# Patient Record
Sex: Male | Born: 1990 | Race: White | Hispanic: No | Marital: Single | Smoking: Never smoker
Health system: Southern US, Community
[De-identification: ages and names within clinical notes are randomized; demographics above are authoritative.]

## PROBLEM LIST (undated history)

## (undated) DIAGNOSIS — A048 Other specified bacterial intestinal infections: Secondary | ICD-10-CM

## (undated) HISTORY — DX: Other specified bacterial intestinal infections: A04.8

---
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
## 2019-07-18 ENCOUNTER — Emergency Department (HOSPITAL_COMMUNITY): Payer: Self-pay

## 2019-07-18 ENCOUNTER — Emergency Department (HOSPITAL_COMMUNITY)
Admission: EM | Admit: 2019-07-18 | Discharge: 2019-07-18 | Disposition: A | Discharge status: Home / Self Care | Payer: Self-pay | Attending: Emergency Medicine | Admitting: Emergency Medicine

## 2019-07-18 ENCOUNTER — Encounter (HOSPITAL_COMMUNITY): Payer: Self-pay | Admitting: Emergency Medicine

## 2019-07-18 ENCOUNTER — Other Ambulatory Visit: Payer: Self-pay

## 2019-07-18 DIAGNOSIS — R197 Diarrhea, unspecified: Secondary | ICD-10-CM

## 2019-07-18 DIAGNOSIS — R112 Nausea with vomiting, unspecified: Secondary | ICD-10-CM | POA: Insufficient documentation

## 2019-07-18 DIAGNOSIS — R1013 Epigastric pain: Secondary | ICD-10-CM | POA: Insufficient documentation

## 2019-07-18 LAB — COMPREHENSIVE METABOLIC PANEL
ALT: 17 U/L (ref 0–44)
AST: 30 U/L (ref 15–41)
Albumin: 4.8 g/dL (ref 3.5–5.0)
Alkaline Phosphatase: 63 U/L (ref 38–126)
Anion gap: 17 — ABNORMAL HIGH (ref 5–15)
BUN: 9 mg/dL (ref 6–20)
CO2: 19 mmol/L — ABNORMAL LOW (ref 22–32)
Calcium: 10 mg/dL (ref 8.9–10.3)
Chloride: 101 mmol/L (ref 98–111)
Creatinine, Ser: 0.93 mg/dL (ref 0.61–1.24)
GFR calc Af Amer: >60 mL/min (ref >60)
GFR calc non Af Amer: >60 mL/min (ref >60)
Glucose, Bld: 144 mg/dL — ABNORMAL HIGH (ref 70–99)
Potassium: 3.5 mmol/L (ref 3.5–5.1)
Sodium: 137 mmol/L (ref 135–145)
Total Bilirubin: 1.3 mg/dL — ABNORMAL HIGH (ref 0.3–1.2)
Total Protein: 7.7 g/dL (ref 6.5–8.1)

## 2019-07-18 LAB — LIPASE, BLOOD: Lipase: 30 U/L (ref 11–51)

## 2019-07-18 LAB — CBC WITH DIFFERENTIAL/PLATELET
Abs Immature Granulocytes: 0.02 10*3/uL (ref 0.00–0.07)
Basophils Absolute: 0.1 10*3/uL (ref 0.0–0.1)
Basophils Relative: 1 %
Eosinophils Absolute: 0 10*3/uL (ref 0.0–0.5)
Eosinophils Relative: 1 %
HCT: 44 % (ref 39.0–52.0)
Hemoglobin: 15.2 g/dL (ref 13.0–17.0)
Immature Granulocytes: 0 %
Lymphocytes Relative: 14 %
Lymphs Abs: 1.1 10*3/uL (ref 0.7–4.0)
MCH: 28.6 pg (ref 26.0–34.0)
MCHC: 34.5 g/dL (ref 30.0–36.0)
MCV: 82.7 fL (ref 80.0–100.0)
Monocytes Absolute: 0.5 10*3/uL (ref 0.1–1.0)
Monocytes Relative: 6 %
Neutro Abs: 6.3 10*3/uL (ref 1.7–7.7)
Neutrophils Relative %: 78 %
Platelets: 316 10*3/uL (ref 150–400)
RBC: 5.32 MIL/uL (ref 4.22–5.81)
RDW: 11.6 % (ref 11.5–15.5)
WBC: 8 10*3/uL (ref 4.0–10.5)
nRBC: 0 % (ref 0.0–0.2)

## 2019-07-18 LAB — URINALYSIS, ROUTINE W REFLEX MICROSCOPIC
Bacteria, UA: NONE SEEN
Bilirubin Urine: NEGATIVE
Glucose, UA: NEGATIVE mg/dL
Hgb urine dipstick: NEGATIVE
Ketones, ur: 20 mg/dL — AB
Leukocytes,Ua: NEGATIVE
Nitrite: NEGATIVE
Protein, ur: 100 mg/dL — AB
Specific Gravity, Urine: 1.02 (ref 1.005–1.030)
pH: 9 — ABNORMAL HIGH (ref 5.0–8.0)

## 2019-07-18 MED ORDER — SODIUM CHLORIDE 0.9 % IV BOLUS: 1000.0000 mL | Freq: Once | INTRAVENOUS | Status: DC

## 2019-07-18 MED ORDER — IOHEXOL 300 MG/ML  SOLN
100.0000 mL | Freq: Once | INTRAMUSCULAR | Status: AC | PRN
  Administered 2019-07-18: 100 mL via INTRAVENOUS

## 2019-07-18 MED ORDER — SODIUM CHLORIDE 0.9 % IV BOLUS
1000.0000 mL | Freq: Once | INTRAVENOUS | Status: AC
  Administered 2019-07-18: 1000 mL via INTRAVENOUS

## 2019-07-18 MED ORDER — ONDANSETRON 4 MG PO TBDP: 4.0000 mg | ORAL_TABLET | Freq: Three times a day (TID) | ORAL | 0 refills | Status: AC | PRN

## 2019-07-18 MED ORDER — HYDROMORPHONE HCL 1 MG/ML IJ SOLN
1.0000 mg | Freq: Once | INTRAMUSCULAR | Status: AC
  Administered 2019-07-18: 16:00:00 1 mg via INTRAVENOUS
  Filled 2019-07-18: qty 1

## 2019-07-18 MED ORDER — FAMOTIDINE IN NACL 20-0.9 MG/50ML-% IV SOLN
20.0000 mg | Freq: Once | INTRAVENOUS | Status: AC
  Administered 2019-07-18: 12:00:00 20 mg via INTRAVENOUS
  Filled 2019-07-18: qty 50

## 2019-07-18 MED ORDER — FAMOTIDINE 20 MG PO TABS: 20.0000 mg | ORAL_TABLET | Freq: Two times a day (BID) | ORAL | 0 refills | Status: AC

## 2019-07-18 MED ORDER — DICYCLOMINE HCL 20 MG PO TABS: 20.0000 mg | ORAL_TABLET | Freq: Two times a day (BID) | ORAL | 0 refills | Status: AC

## 2019-07-18 MED ORDER — ONDANSETRON HCL 4 MG/2ML IJ SOLN
4.0000 mg | Freq: Once | INTRAMUSCULAR | Status: AC
  Administered 2019-07-18: 12:00:00 4 mg via INTRAVENOUS
  Filled 2019-07-18: qty 2

## 2019-07-18 NOTE — ED Provider Notes (Signed)
Bobby Young   CSN: [id number] Arrival date & time: 07/18/19  1043     History   Chief Complaint Chief Complaint  Patient presents with   Emesis   Chills   Diarrhea    HPI Bobby Young is a 28 y.o. male with history of H. pylori infection presenting for evaluation of acute onset, persistent nausea, vomiting, and diarrhea beginning around 4 AM this morning.  He reports multiple episodes of nonbloody nonbilious emesis and several episodes of watery nonbloody diarrhea.  He reports abdominal discomfort in the epigastric region.  He reports "hyperventilating "due to the discomfort but denies shortness of breath, chest pain, fever, cough.  Denies urinary symptoms.  Has not tried anything for his symptoms.  No aggravating or alleviating factors noted.  Reports he has not had any alcohol in several months, occasionally smokes marijuana.  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       The history is provided by the patient.    Past Medical History:  Diagnosis Date   Abdominal pain    H. pylori infection     Patient Active Problem List   Diagnosis Date Noted   H. pylori infection    Abdominal pain     History reviewed. No pertinent surgical history.      Home Medications    Prior to Admission medications   Medication Sig Start Date End Date Taking? Authorizing Provider  dicyclomine (BENTYL) 20 MG tablet Take 1 tablet (20 mg total) by mouth 2 (two) times daily. 07/18/19   Virgil Slinger A, PA-C  famotidine (PEPCID) 20 MG tablet Take 1 tablet (20 mg total) by mouth 2 (two) times daily. 07/18/19   Nils Flack, Eldra Word A, PA-C  ondansetron (ZOFRAN ODT) 4 MG disintegrating tablet Take 1 tablet (4 mg total) by mouth every 8 (eight) hours as needed for nausea or vomiting. 07/18/19   Renita Papa, PA-C    Family History History reviewed. No pertinent family  history.  Social History Social History   Tobacco Use   Smoking status: Never Smoker  Substance Use Topics   Alcohol use: Yes   Drug use: Yes    Types: Marijuana     Allergies   Eggs or egg-derived products and Penicillins   Review of Systems Review of Systems  Constitutional: Negative for chills and fever.  Respiratory: Negative for cough and shortness of breath.   Cardiovascular: Negative for chest pain.  Gastrointestinal: Positive for abdominal pain, diarrhea, nausea and vomiting.  Genitourinary: Negative for dysuria and hematuria.  All other systems reviewed and are negative.    Physical Exam Updated Vital Signs BP 122/72    Pulse 75    Temp 97.9 F (36.6 C) (Oral)    Resp 15    Ht 5\' 10"  (1.778 m)    Wt 79.4 kg    SpO2 100%    BMI 25.11 kg/m   Physical Exam Vitals signs and nursing Young reviewed.  Constitutional:      General: He is not in acute distress.    Appearance: He is well-developed.     Comments: Appears uncomfortable  HENT:     Head: Normocephalic and atraumatic.  Eyes:     General:        Right eye: No discharge.        Left eye: No discharge.     Conjunctiva/sclera:  Conjunctivae normal.  Neck:     Vascular: No JVD.     Trachea: No tracheal deviation.  Cardiovascular:     Rate and Rhythm: Normal rate and regular rhythm.     Heart sounds: Normal heart sounds.  Pulmonary:     Effort: Pulmonary effort is normal.     Breath sounds: Normal breath sounds.  Abdominal:     General: Abdomen is flat. There is no distension.     Palpations: Abdomen is soft.     Tenderness: There is abdominal tenderness in the epigastric area. There is no right CVA tenderness, left CVA tenderness, guarding or rebound.  Skin:    General: Skin is warm and dry.     Findings: No erythema.  Neurological:     Mental Status: He is alert.  Psychiatric:        Behavior: Behavior normal.      ED Treatments / Results  Labs (all labs ordered are listed, but only  abnormal results are displayed) Labs Reviewed  COMPREHENSIVE METABOLIC PANEL - Abnormal; Notable for the following components:      Result Value   CO2 19 (*)    Glucose, Bld 144 (*)    Total Bilirubin 1.3 (*)    Anion gap 17 (*)    All other components within normal limits  URINALYSIS, ROUTINE W REFLEX MICROSCOPIC - Abnormal; Notable for the following components:   pH 9.0 (*)    Ketones, ur 20 (*)    Protein, ur 100 (*)    All other components within normal limits  CBC WITH DIFFERENTIAL/PLATELET  LIPASE, BLOOD    EKG None  Radiology Ct Abdomen Pelvis W Contrast  Result Date: 07/18/2019 CLINICAL DATA:  Vomiting, diarrhea and body aches. EXAM: CT ABDOMEN AND PELVIS WITH CONTRAST TECHNIQUE: Multidetector CT imaging of the abdomen and pelvis was performed using the standard protocol following bolus administration of intravenous contrast. CONTRAST:  100mL OMNIPAQUE IOHEXOL 300 MG/ML  SOLN COMPARISON:  No report of prior CT scan dated 11/28/2013 FINDINGS: Lower chest: Normal. Hepatobiliary: Mild diffuse hepatic steatosis with mild hepatomegaly. Otherwise negative. Biliary tree is normal. Pancreas: Unremarkable. No pancreatic ductal dilatation or surrounding inflammatory changes. Spleen: Normal in size without focal abnormality. Adrenals/Urinary Tract: Adrenal glands are unremarkable. Kidneys are normal, without renal calculi, focal lesion, or hydronephrosis. Bladder is unremarkable. Stomach/Bowel: Stomach is within normal limits. Appendix appears normal. No evidence of bowel wall thickening, distention, or inflammatory changes. Vascular/Lymphatic: No significant vascular findings are present. No enlarged abdominal or pelvic lymph nodes. Reproductive: Prostate is unremarkable. Other: No abdominal wall hernia or abnormality. No abdominopelvic ascites. Musculoskeletal: No acute or significant osseous findings. IMPRESSION: Mild diffuse hepatic steatosis and hepatomegaly, chronic by report. Otherwise,  benign appearing abdomen and pelvis. Electronically Signed   By: Francene BoyersJames  Maxwell M.D.   On: 07/18/2019 15:53    Procedures Procedures (including critical care time)  Medications Ordered in ED Medications  sodium chloride 0.9 % bolus 1,000 mL (0 mLs Intravenous Stopped 07/18/19 1322)  ondansetron (ZOFRAN) injection 4 mg (4 mg Intravenous Given 07/18/19 1208)  famotidine (PEPCID) IVPB 20 mg premix (0 mg Intravenous Stopped 07/18/19 1256)  HYDROmorphone (DILAUDID) injection 1 mg (1 mg Intravenous Given 07/18/19 1535)  iohexol (OMNIPAQUE) 300 MG/ML solution 100 mL (100 mLs Intravenous Contrast Given 07/18/19 1545)     Initial Impression / Assessment and Plan / ED Course  I have reviewed the triage vital signs and the nursing notes.  Pertinent labs & imaging results that were  available during my care of the patient were reviewed by me and considered in my medical decision making (see chart for details).        Patient sent from methadone clinic for evaluation of nausea, vomiting, body aches, upper abdominal pain.  He is afebrile, vital signs are stable.  He however, he appears uncomfortable, hyperventilating.  No peritoneal signs on examination of the abdomen but he does have some epigastric abdominal pain.  Has a history of H. pylori infection.  Lab work today show no leukocytosis, no anemia.  His renal function is within normal limits.  His bicarb is low, could be secondary to hyperventilation but coupled with elevated anion gap could be starvation ketosis as he also has ketones in his urine.  He does not appear to be septic at this time.  UA does not suggest nephrolithiasis or UTI but does suggest dehydration.  He received IV fluids, Zofran, Pepcid in the ED with a little bit of improvement and then had worsening abdominal pain so was given 1 dose of Dilaudid so that he could comfortably undergo his CT scan.  He did report that this helped his pain.  CT scans show no evidence of acute surgical  abdominal pathology but does show some mild diffuse hepatic steatosis and hepatomegaly, chronic appearing.  On reevaluation patient resting more comfortably, tolerating p.o. fluids without difficulty.  Serial abdominal examinations are benign.  He reports that he feels as though he is withdrawing but that the Dilaudid "took the edge off ".  He feels comfortable with discharge home and will follow-up in his methadone clinic tomorrow.  He appears to have good social support with his significant other at the bedside who is quite attentive.  Suspect there is some component of his gastritis/PUD versus methadone withdrawal contributing to his symptoms today.  No evidence of perforated viscus.  Conservative therapy indicated and discussed with patient, will discharge with Zofran, Pepcid, Bentyl.  Discussed pushing fluids, advancing diet slowly.  Discussed strict ED return precautions.  Recommend follow-up with PCP for reevaluation of symptoms.  Patient and significant other verbalized understanding of and agreement with plan and patient stable for discharge home at this time.  Patient was seen and evaluated by Dr. Dalene Seltzer who agrees with assessment and plan at this time.  Final Clinical Impressions(s) / ED Diagnoses   Final diagnoses:  Nausea vomiting and diarrhea  Epigastric pain    ED Discharge Orders         Ordered    ondansetron (ZOFRAN ODT) 4 MG disintegrating tablet  Every 8 hours PRN     07/18/19 1623    famotidine (PEPCID) 20 MG tablet  2 times daily     07/18/19 1623    dicyclomine (BENTYL) 20 MG tablet  2 times daily     07/18/19 [address], [place] A, PA-C 07/19/19 1831    Alvira Monday, MD 07/19/19 2308

## 2019-07-18 NOTE — Discharge Instructions (Addendum)
You received Zofran for nausea, Pepcid and Dilaudid for pain today while you are in the emergency department.  1. Medications: Take Zofran as needed for nausea.  Let this medicine dissolve under your tongue and wait around 10-20 minutes before eating or drinking after taking this medication.  Take Pepcid twice daily with food.  You can take Bentyl as needed for crampy abdominal pain.  If you need medication assistance, you can download the [facility] app on your phone which will give discounts for most medications.  2. Treatment: rest, drink plenty of fluids, advance diet slowly.  Start with water and broth then advance to bland foods that will not upset your stomach such as crackers, mashed potatoes, and peanut butter. 3. Follow Up: Please followup with your primary doctor or a gastroenterologist in 3-5 days for discussion of your diagnoses and further evaluation after today's visit; Please return to the ER for persistent vomiting, high fevers or worsening symptoms.

## 2019-07-18 NOTE — ED Triage Notes (Signed)
Pt arrives to ED from home with complaints of emesis, diarrhea, and body aches since 0400 today. Patient states he was on the way to get his methadone today and they wound not let him get his dose.

## 2019-07-19 DIAGNOSIS — Z20828 Contact with and (suspected) exposure to other viral communicable diseases: Secondary | ICD-10-CM | POA: Diagnosis not present

## 2019-12-26 DIAGNOSIS — F411 Generalized anxiety disorder: Secondary | ICD-10-CM | POA: Diagnosis not present

## 2020-01-04 DIAGNOSIS — F411 Generalized anxiety disorder: Secondary | ICD-10-CM | POA: Diagnosis not present

## 2020-01-08 DIAGNOSIS — F411 Generalized anxiety disorder: Secondary | ICD-10-CM | POA: Diagnosis not present

## 2020-01-16 DIAGNOSIS — F411 Generalized anxiety disorder: Secondary | ICD-10-CM | POA: Diagnosis not present

## 2020-01-22 DIAGNOSIS — F411 Generalized anxiety disorder: Secondary | ICD-10-CM | POA: Diagnosis not present

## 2020-01-29 DIAGNOSIS — F411 Generalized anxiety disorder: Secondary | ICD-10-CM | POA: Diagnosis not present

## 2020-02-06 DIAGNOSIS — F411 Generalized anxiety disorder: Secondary | ICD-10-CM | POA: Diagnosis not present

## 2020-02-20 DIAGNOSIS — F411 Generalized anxiety disorder: Secondary | ICD-10-CM | POA: Diagnosis not present

## 2020-02-28 DIAGNOSIS — M5413 Radiculopathy, cervicothoracic region: Secondary | ICD-10-CM | POA: Diagnosis not present

## 2020-02-28 DIAGNOSIS — M25512 Pain in left shoulder: Secondary | ICD-10-CM | POA: Diagnosis not present

## 2020-02-28 DIAGNOSIS — M9901 Segmental and somatic dysfunction of cervical region: Secondary | ICD-10-CM | POA: Diagnosis not present

## 2020-02-28 DIAGNOSIS — M9902 Segmental and somatic dysfunction of thoracic region: Secondary | ICD-10-CM | POA: Diagnosis not present

## 2020-03-03 DIAGNOSIS — M25512 Pain in left shoulder: Secondary | ICD-10-CM | POA: Diagnosis not present

## 2020-03-03 DIAGNOSIS — M9902 Segmental and somatic dysfunction of thoracic region: Secondary | ICD-10-CM | POA: Diagnosis not present

## 2020-03-03 DIAGNOSIS — M9901 Segmental and somatic dysfunction of cervical region: Secondary | ICD-10-CM | POA: Diagnosis not present

## 2020-03-03 DIAGNOSIS — M5413 Radiculopathy, cervicothoracic region: Secondary | ICD-10-CM | POA: Diagnosis not present

## 2020-03-10 DIAGNOSIS — M5413 Radiculopathy, cervicothoracic region: Secondary | ICD-10-CM | POA: Diagnosis not present

## 2020-03-10 DIAGNOSIS — M9901 Segmental and somatic dysfunction of cervical region: Secondary | ICD-10-CM | POA: Diagnosis not present

## 2020-03-10 DIAGNOSIS — M25512 Pain in left shoulder: Secondary | ICD-10-CM | POA: Diagnosis not present

## 2020-03-10 DIAGNOSIS — M9902 Segmental and somatic dysfunction of thoracic region: Secondary | ICD-10-CM | POA: Diagnosis not present

## 2020-03-14 DIAGNOSIS — M9901 Segmental and somatic dysfunction of cervical region: Secondary | ICD-10-CM | POA: Diagnosis not present

## 2020-03-14 DIAGNOSIS — M5413 Radiculopathy, cervicothoracic region: Secondary | ICD-10-CM | POA: Diagnosis not present

## 2020-03-14 DIAGNOSIS — M9902 Segmental and somatic dysfunction of thoracic region: Secondary | ICD-10-CM | POA: Diagnosis not present

## 2020-03-14 DIAGNOSIS — M25512 Pain in left shoulder: Secondary | ICD-10-CM | POA: Diagnosis not present

## 2020-06-10 DIAGNOSIS — R634 Abnormal weight loss: Secondary | ICD-10-CM | POA: Diagnosis not present

## 2020-06-10 DIAGNOSIS — Z131 Encounter for screening for diabetes mellitus: Secondary | ICD-10-CM | POA: Diagnosis not present

## 2020-06-10 DIAGNOSIS — R197 Diarrhea, unspecified: Secondary | ICD-10-CM | POA: Diagnosis not present

## 2020-06-10 DIAGNOSIS — R1084 Generalized abdominal pain: Secondary | ICD-10-CM | POA: Diagnosis not present

## 2020-06-10 DIAGNOSIS — Z1331 Encounter for screening for depression: Secondary | ICD-10-CM | POA: Diagnosis not present

## 2020-06-12 DIAGNOSIS — R197 Diarrhea, unspecified: Secondary | ICD-10-CM | POA: Diagnosis not present

## 2020-07-08 DIAGNOSIS — F1120 Opioid dependence, uncomplicated: Secondary | ICD-10-CM | POA: Diagnosis not present

## 2020-07-15 DIAGNOSIS — F1120 Opioid dependence, uncomplicated: Secondary | ICD-10-CM | POA: Diagnosis not present

## 2020-08-15 DIAGNOSIS — F1120 Opioid dependence, uncomplicated: Secondary | ICD-10-CM | POA: Diagnosis not present

## 2020-08-16 DIAGNOSIS — F1120 Opioid dependence, uncomplicated: Secondary | ICD-10-CM | POA: Diagnosis not present

## 2020-08-17 DIAGNOSIS — F1120 Opioid dependence, uncomplicated: Secondary | ICD-10-CM | POA: Diagnosis not present

## 2020-08-18 DIAGNOSIS — F1120 Opioid dependence, uncomplicated: Secondary | ICD-10-CM | POA: Diagnosis not present

## 2020-08-19 DIAGNOSIS — F1120 Opioid dependence, uncomplicated: Secondary | ICD-10-CM | POA: Diagnosis not present

## 2020-08-20 DIAGNOSIS — F1120 Opioid dependence, uncomplicated: Secondary | ICD-10-CM | POA: Diagnosis not present

## 2020-08-21 DIAGNOSIS — F1120 Opioid dependence, uncomplicated: Secondary | ICD-10-CM | POA: Diagnosis not present

## 2020-08-22 DIAGNOSIS — F1120 Opioid dependence, uncomplicated: Secondary | ICD-10-CM | POA: Diagnosis not present

## 2020-08-23 DIAGNOSIS — F1120 Opioid dependence, uncomplicated: Secondary | ICD-10-CM | POA: Diagnosis not present

## 2020-08-24 DIAGNOSIS — F1120 Opioid dependence, uncomplicated: Secondary | ICD-10-CM | POA: Diagnosis not present

## 2020-08-25 DIAGNOSIS — F1120 Opioid dependence, uncomplicated: Secondary | ICD-10-CM | POA: Diagnosis not present

## 2020-08-26 DIAGNOSIS — F1120 Opioid dependence, uncomplicated: Secondary | ICD-10-CM | POA: Diagnosis not present

## 2020-08-27 DIAGNOSIS — F1120 Opioid dependence, uncomplicated: Secondary | ICD-10-CM | POA: Diagnosis not present

## 2020-08-28 DIAGNOSIS — F1120 Opioid dependence, uncomplicated: Secondary | ICD-10-CM | POA: Diagnosis not present

## 2020-08-29 DIAGNOSIS — F1120 Opioid dependence, uncomplicated: Secondary | ICD-10-CM | POA: Diagnosis not present

## 2020-08-30 DIAGNOSIS — F1120 Opioid dependence, uncomplicated: Secondary | ICD-10-CM | POA: Diagnosis not present

## 2020-08-31 DIAGNOSIS — F1120 Opioid dependence, uncomplicated: Secondary | ICD-10-CM | POA: Diagnosis not present

## 2020-09-01 DIAGNOSIS — F1120 Opioid dependence, uncomplicated: Secondary | ICD-10-CM | POA: Diagnosis not present

## 2020-09-02 DIAGNOSIS — F1120 Opioid dependence, uncomplicated: Secondary | ICD-10-CM | POA: Diagnosis not present

## 2020-09-03 DIAGNOSIS — F1120 Opioid dependence, uncomplicated: Secondary | ICD-10-CM | POA: Diagnosis not present

## 2020-09-04 DIAGNOSIS — F1120 Opioid dependence, uncomplicated: Secondary | ICD-10-CM | POA: Diagnosis not present

## 2020-09-05 DIAGNOSIS — F1120 Opioid dependence, uncomplicated: Secondary | ICD-10-CM | POA: Diagnosis not present

## 2020-09-06 DIAGNOSIS — F1120 Opioid dependence, uncomplicated: Secondary | ICD-10-CM | POA: Diagnosis not present

## 2020-09-07 DIAGNOSIS — F1120 Opioid dependence, uncomplicated: Secondary | ICD-10-CM | POA: Diagnosis not present

## 2020-09-08 DIAGNOSIS — F1120 Opioid dependence, uncomplicated: Secondary | ICD-10-CM | POA: Diagnosis not present

## 2020-09-09 DIAGNOSIS — F1120 Opioid dependence, uncomplicated: Secondary | ICD-10-CM | POA: Diagnosis not present

## 2020-09-10 DIAGNOSIS — F1120 Opioid dependence, uncomplicated: Secondary | ICD-10-CM | POA: Diagnosis not present

## 2020-09-11 DIAGNOSIS — F1120 Opioid dependence, uncomplicated: Secondary | ICD-10-CM | POA: Diagnosis not present

## 2020-09-12 DIAGNOSIS — F1120 Opioid dependence, uncomplicated: Secondary | ICD-10-CM | POA: Diagnosis not present

## 2020-09-13 DIAGNOSIS — F1120 Opioid dependence, uncomplicated: Secondary | ICD-10-CM | POA: Diagnosis not present

## 2020-09-14 DIAGNOSIS — F1120 Opioid dependence, uncomplicated: Secondary | ICD-10-CM | POA: Diagnosis not present

## 2020-09-15 DIAGNOSIS — F1120 Opioid dependence, uncomplicated: Secondary | ICD-10-CM | POA: Diagnosis not present

## 2020-09-17 DIAGNOSIS — F1120 Opioid dependence, uncomplicated: Secondary | ICD-10-CM | POA: Diagnosis not present

## 2020-09-18 DIAGNOSIS — F1120 Opioid dependence, uncomplicated: Secondary | ICD-10-CM | POA: Diagnosis not present

## 2020-09-19 DIAGNOSIS — F1120 Opioid dependence, uncomplicated: Secondary | ICD-10-CM | POA: Diagnosis not present

## 2020-09-20 DIAGNOSIS — F1120 Opioid dependence, uncomplicated: Secondary | ICD-10-CM | POA: Diagnosis not present

## 2020-09-21 DIAGNOSIS — F1120 Opioid dependence, uncomplicated: Secondary | ICD-10-CM | POA: Diagnosis not present

## 2020-09-22 DIAGNOSIS — F1120 Opioid dependence, uncomplicated: Secondary | ICD-10-CM | POA: Diagnosis not present

## 2020-09-23 DIAGNOSIS — F1120 Opioid dependence, uncomplicated: Secondary | ICD-10-CM | POA: Diagnosis not present

## 2020-09-24 DIAGNOSIS — F1120 Opioid dependence, uncomplicated: Secondary | ICD-10-CM | POA: Diagnosis not present

## 2020-09-25 DIAGNOSIS — F1120 Opioid dependence, uncomplicated: Secondary | ICD-10-CM | POA: Diagnosis not present

## 2020-09-26 DIAGNOSIS — F1120 Opioid dependence, uncomplicated: Secondary | ICD-10-CM | POA: Diagnosis not present

## 2020-09-27 DIAGNOSIS — F1120 Opioid dependence, uncomplicated: Secondary | ICD-10-CM | POA: Diagnosis not present

## 2020-09-28 DIAGNOSIS — F1120 Opioid dependence, uncomplicated: Secondary | ICD-10-CM | POA: Diagnosis not present

## 2020-09-29 DIAGNOSIS — F1120 Opioid dependence, uncomplicated: Secondary | ICD-10-CM | POA: Diagnosis not present

## 2020-09-30 DIAGNOSIS — F1120 Opioid dependence, uncomplicated: Secondary | ICD-10-CM | POA: Diagnosis not present

## 2020-10-01 DIAGNOSIS — F1120 Opioid dependence, uncomplicated: Secondary | ICD-10-CM | POA: Diagnosis not present

## 2020-10-02 DIAGNOSIS — F1120 Opioid dependence, uncomplicated: Secondary | ICD-10-CM | POA: Diagnosis not present

## 2020-10-03 DIAGNOSIS — F1120 Opioid dependence, uncomplicated: Secondary | ICD-10-CM | POA: Diagnosis not present

## 2020-10-04 DIAGNOSIS — F1120 Opioid dependence, uncomplicated: Secondary | ICD-10-CM | POA: Diagnosis not present

## 2020-10-05 DIAGNOSIS — F1120 Opioid dependence, uncomplicated: Secondary | ICD-10-CM | POA: Diagnosis not present

## 2020-10-06 DIAGNOSIS — F1120 Opioid dependence, uncomplicated: Secondary | ICD-10-CM | POA: Diagnosis not present

## 2020-10-07 DIAGNOSIS — F1120 Opioid dependence, uncomplicated: Secondary | ICD-10-CM | POA: Diagnosis not present

## 2020-10-08 DIAGNOSIS — F1120 Opioid dependence, uncomplicated: Secondary | ICD-10-CM | POA: Diagnosis not present

## 2020-10-09 DIAGNOSIS — F1120 Opioid dependence, uncomplicated: Secondary | ICD-10-CM | POA: Diagnosis not present

## 2020-10-10 DIAGNOSIS — F1120 Opioid dependence, uncomplicated: Secondary | ICD-10-CM | POA: Diagnosis not present

## 2020-10-11 DIAGNOSIS — F1120 Opioid dependence, uncomplicated: Secondary | ICD-10-CM | POA: Diagnosis not present

## 2020-10-12 DIAGNOSIS — F1120 Opioid dependence, uncomplicated: Secondary | ICD-10-CM | POA: Diagnosis not present

## 2020-10-13 DIAGNOSIS — F1120 Opioid dependence, uncomplicated: Secondary | ICD-10-CM | POA: Diagnosis not present

## 2020-10-14 DIAGNOSIS — F1120 Opioid dependence, uncomplicated: Secondary | ICD-10-CM | POA: Diagnosis not present

## 2020-10-17 DIAGNOSIS — F1120 Opioid dependence, uncomplicated: Secondary | ICD-10-CM | POA: Diagnosis not present

## 2020-10-18 DIAGNOSIS — F1120 Opioid dependence, uncomplicated: Secondary | ICD-10-CM | POA: Diagnosis not present

## 2020-10-19 DIAGNOSIS — F1120 Opioid dependence, uncomplicated: Secondary | ICD-10-CM | POA: Diagnosis not present

## 2020-10-20 DIAGNOSIS — F1120 Opioid dependence, uncomplicated: Secondary | ICD-10-CM | POA: Diagnosis not present

## 2020-10-21 DIAGNOSIS — F1120 Opioid dependence, uncomplicated: Secondary | ICD-10-CM | POA: Diagnosis not present

## 2020-10-21 DIAGNOSIS — U071 COVID-19: Secondary | ICD-10-CM | POA: Diagnosis not present

## 2020-10-22 DIAGNOSIS — F1120 Opioid dependence, uncomplicated: Secondary | ICD-10-CM | POA: Diagnosis not present

## 2020-10-23 DIAGNOSIS — F1120 Opioid dependence, uncomplicated: Secondary | ICD-10-CM | POA: Diagnosis not present

## 2021-01-15 IMAGING — CT CT ABD-PELV W/ CM
4 series · 13 of 46 positions shown, 18 images · IV contrast (APPLIED)
Comparison: No report of prior CT scan dated 11/28/2013

CLINICAL DATA: Vomiting, diarrhea and body aches.

EXAM:
CT ABDOMEN AND PELVIS WITH CONTRAST
TECHNIQUE: Multidetector CT imaging of the abdomen and pelvis was performed
using the standard protocol following bolus administration of
intravenous contrast.
CONTRAST:  100mL OMNIPAQUE IOHEXOL 300 MG/ML  SOLN

[Series 3: abdomen 5.0 · axial · 0.80mm/px · z∈[+921,+1276]mm · 7 of 95 slices shown, 12 images]
[im 12/95  soft-tissue]
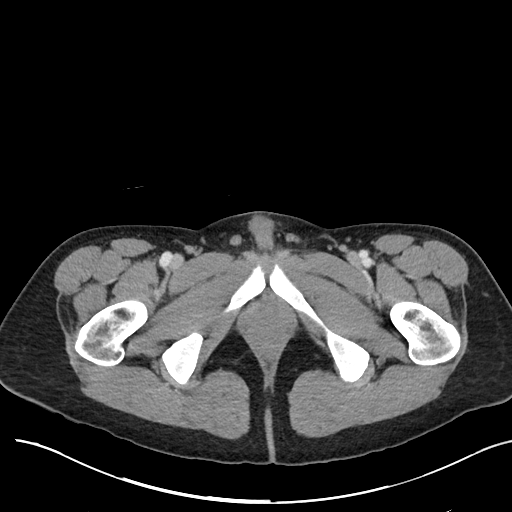
[im 12/95  bone]
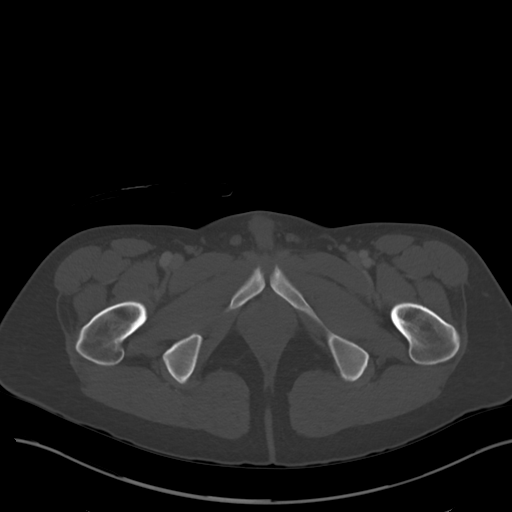
[im 24/95  soft-tissue]
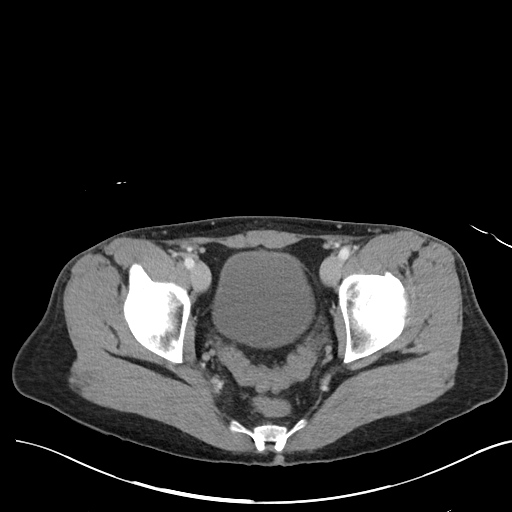
[im 36/95  soft-tissue]
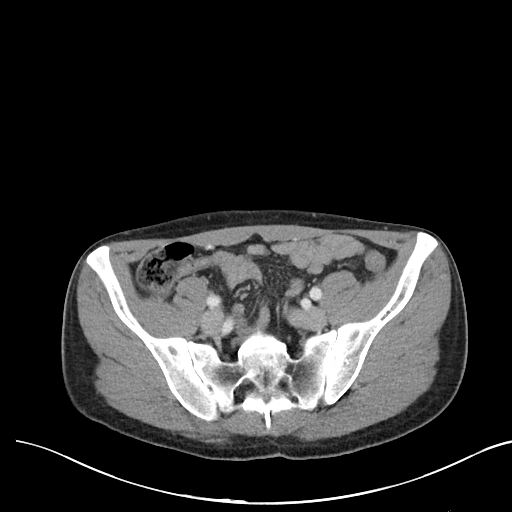
[im 48/95  soft-tissue]
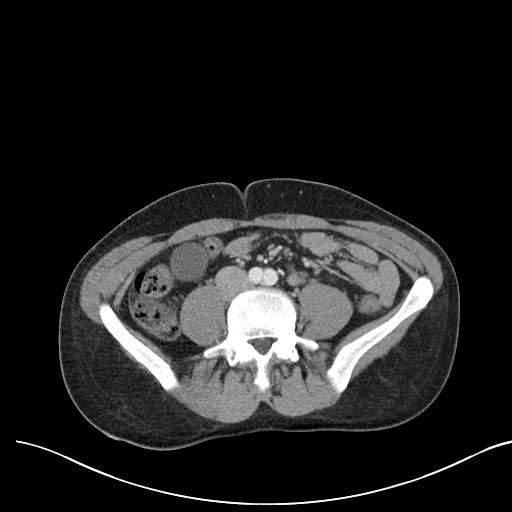
[im 48/95  lung]
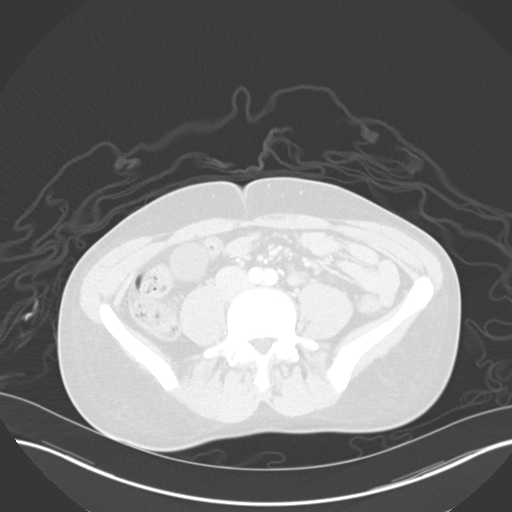
[im 59/95  soft-tissue]
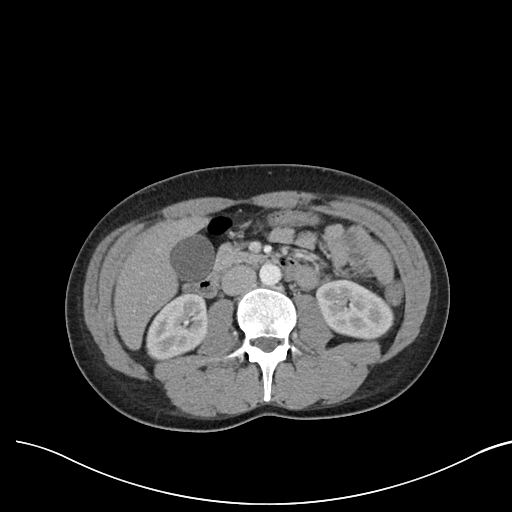
[im 59/95  lung]
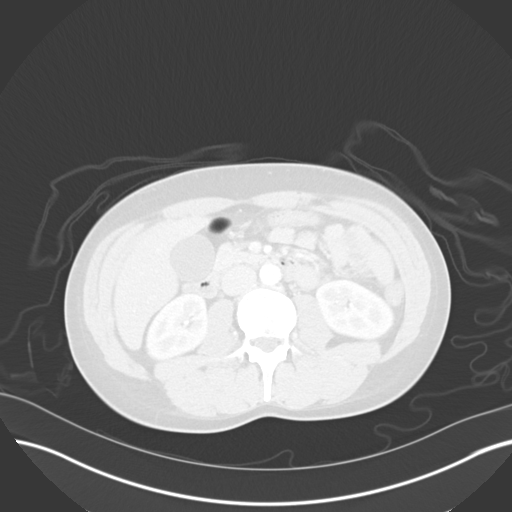
[im 71/95  soft-tissue]
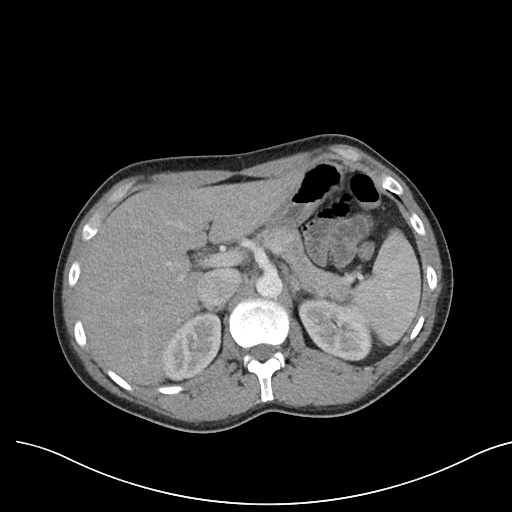
[im 71/95  lung]
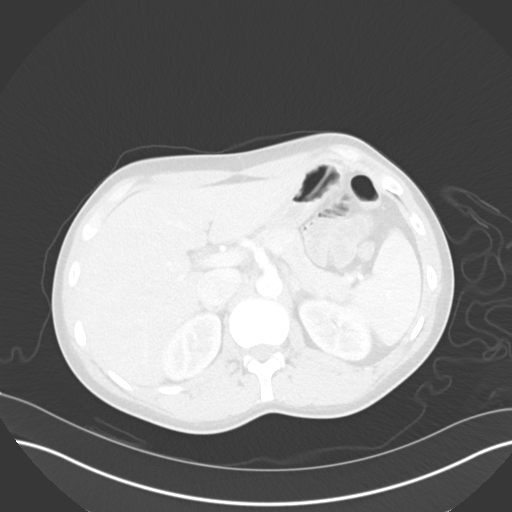
[im 83/95  soft-tissue]
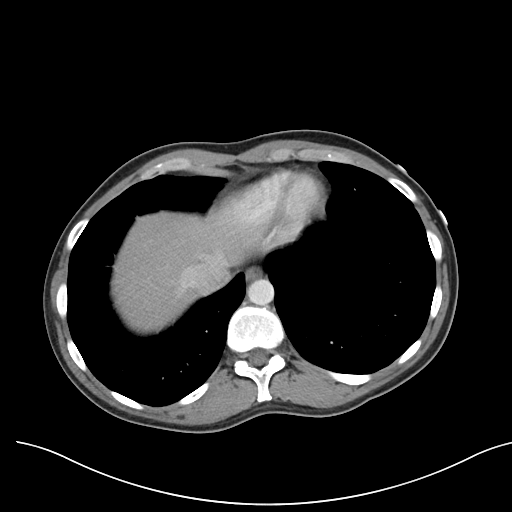
[im 83/95  lung]
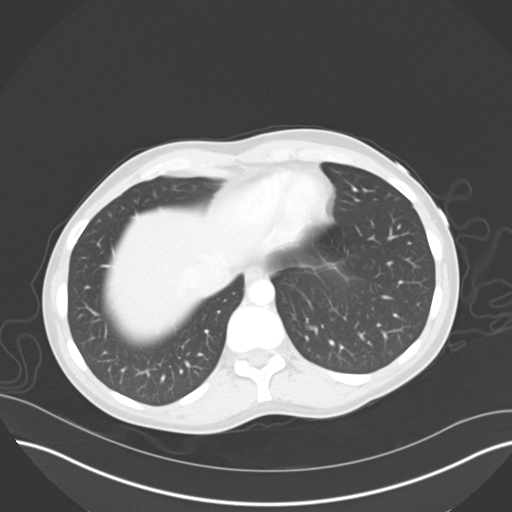

[Series 5: lung · axial · 0.80mm/px · z∈[+906,+948]mm · 2 of 237 slices shown]
[im 22/237  bone]
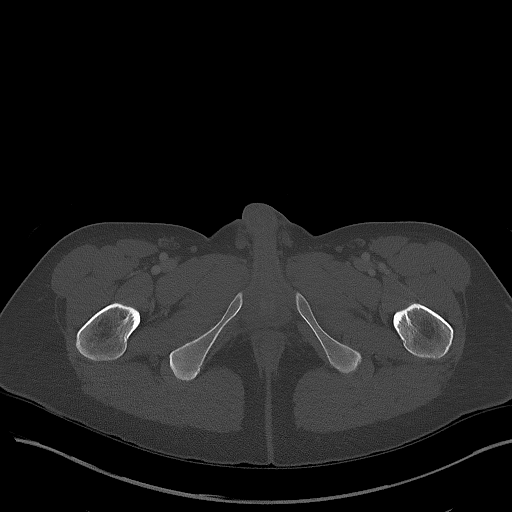
[im 43/237  bone]
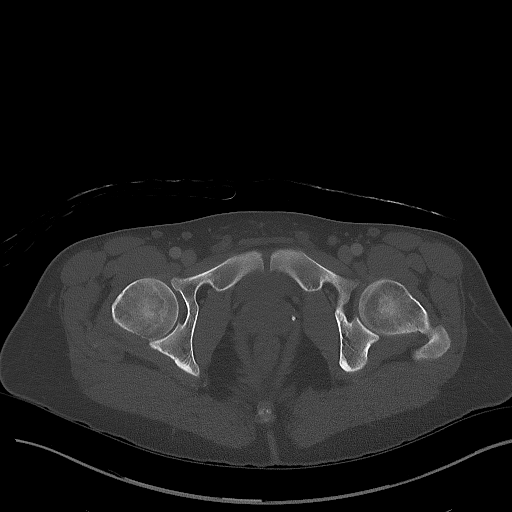

[Series 6: abdomen 3.0 mpr cor · coronal · 0.86mm/px · 3 of 92 slices shown]
[im 31/92  soft-tissue]
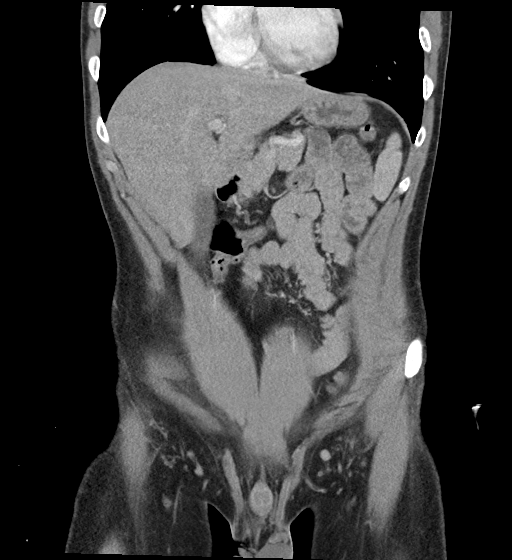
[im 41/92  soft-tissue]
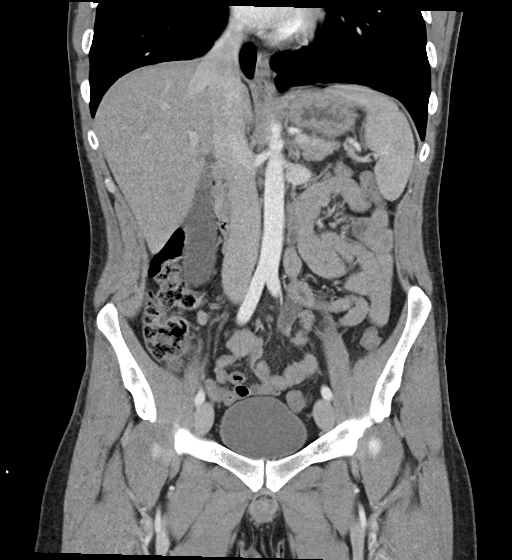
[im 51/92  soft-tissue]
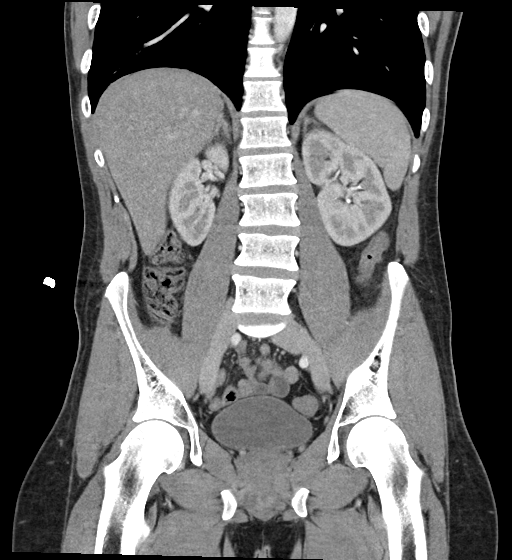

[Series 7: abdomen 3.0 mpr sag · sagittal · 0.54mm/px · 1 of 140 slices shown]
[im 47/140  soft-tissue]
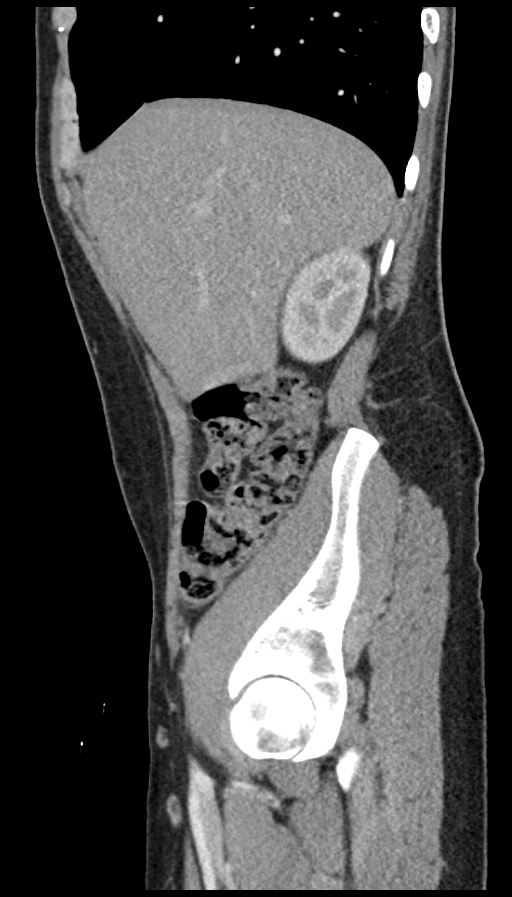

[13 of 46 positions shown; findings below may reference images not displayed]

FINDINGS: Lower chest: Normal.

Hepatobiliary: Mild diffuse hepatic steatosis with mild
hepatomegaly. Otherwise negative. Biliary tree is normal.

Pancreas: Unremarkable. No pancreatic ductal dilatation or
surrounding inflammatory changes.

Spleen: Normal in size without focal abnormality.

Adrenals/Urinary Tract: Adrenal glands are unremarkable. Kidneys are
normal, without renal calculi, focal lesion, or hydronephrosis.
Bladder is unremarkable.

Stomach/Bowel: Stomach is within normal limits. Appendix appears
normal. No evidence of bowel wall thickening, distention, or
inflammatory changes.

Vascular/Lymphatic: No significant vascular findings are present. No
enlarged abdominal or pelvic lymph nodes.

Reproductive: Prostate is unremarkable.

Other: No abdominal wall hernia or abnormality. No abdominopelvic
ascites.

Musculoskeletal: No acute or significant osseous findings.
IMPRESSION: Mild diffuse hepatic steatosis and hepatomegaly, chronic by report.
Otherwise, benign appearing abdomen and pelvis.

## 2021-04-09 DIAGNOSIS — K219 Gastro-esophageal reflux disease without esophagitis: Secondary | ICD-10-CM | POA: Diagnosis not present

## 2021-04-09 DIAGNOSIS — R109 Unspecified abdominal pain: Secondary | ICD-10-CM | POA: Diagnosis not present

## 2021-04-09 DIAGNOSIS — Z6823 Body mass index (BMI) 23.0-23.9, adult: Secondary | ICD-10-CM | POA: Diagnosis not present

## 2021-04-20 DIAGNOSIS — Z6822 Body mass index (BMI) 22.0-22.9, adult: Secondary | ICD-10-CM | POA: Diagnosis not present

## 2021-04-20 DIAGNOSIS — M546 Pain in thoracic spine: Secondary | ICD-10-CM | POA: Diagnosis not present

## 2021-04-20 DIAGNOSIS — R0989 Other specified symptoms and signs involving the circulatory and respiratory systems: Secondary | ICD-10-CM | POA: Diagnosis not present

## 2021-04-21 ENCOUNTER — Other Ambulatory Visit: Payer: Self-pay | Admitting: Nurse Practitioner

## 2021-04-21 ENCOUNTER — Ambulatory Visit
Admission: RE | Admit: 2021-04-21 | Discharge: 2021-04-21 | Disposition: A | Discharge status: Home / Self Care | Payer: BC Managed Care – PPO | Source: Ambulatory Visit | Attending: Nurse Practitioner | Admitting: Nurse Practitioner

## 2021-04-21 DIAGNOSIS — R0989 Other specified symptoms and signs involving the circulatory and respiratory systems: Secondary | ICD-10-CM

## 2021-04-21 DIAGNOSIS — M546 Pain in thoracic spine: Secondary | ICD-10-CM

## 2021-04-21 DIAGNOSIS — R079 Chest pain, unspecified: Secondary | ICD-10-CM | POA: Diagnosis not present

## 2021-05-28 DIAGNOSIS — L237 Allergic contact dermatitis due to plants, except food: Secondary | ICD-10-CM | POA: Diagnosis not present

## 2022-05-05 DIAGNOSIS — M9903 Segmental and somatic dysfunction of lumbar region: Secondary | ICD-10-CM | POA: Diagnosis not present

## 2022-05-05 DIAGNOSIS — M9901 Segmental and somatic dysfunction of cervical region: Secondary | ICD-10-CM | POA: Diagnosis not present

## 2022-05-05 DIAGNOSIS — M9905 Segmental and somatic dysfunction of pelvic region: Secondary | ICD-10-CM | POA: Diagnosis not present

## 2022-05-05 DIAGNOSIS — M9902 Segmental and somatic dysfunction of thoracic region: Secondary | ICD-10-CM | POA: Diagnosis not present

## 2022-05-12 DIAGNOSIS — M9903 Segmental and somatic dysfunction of lumbar region: Secondary | ICD-10-CM | POA: Diagnosis not present

## 2022-05-12 DIAGNOSIS — M9902 Segmental and somatic dysfunction of thoracic region: Secondary | ICD-10-CM | POA: Diagnosis not present

## 2022-05-12 DIAGNOSIS — M9901 Segmental and somatic dysfunction of cervical region: Secondary | ICD-10-CM | POA: Diagnosis not present

## 2022-05-12 DIAGNOSIS — M9905 Segmental and somatic dysfunction of pelvic region: Secondary | ICD-10-CM | POA: Diagnosis not present

## 2022-10-20 IMAGING — CR DG CHEST 2V
2 series · 2 of 2 positions shown · non-contrast
Comparison: None.

CLINICAL DATA: Abnormal lung sounds.  Posterior chest pain.

EXAM:
CHEST - 2 VIEW

[w chest pa]
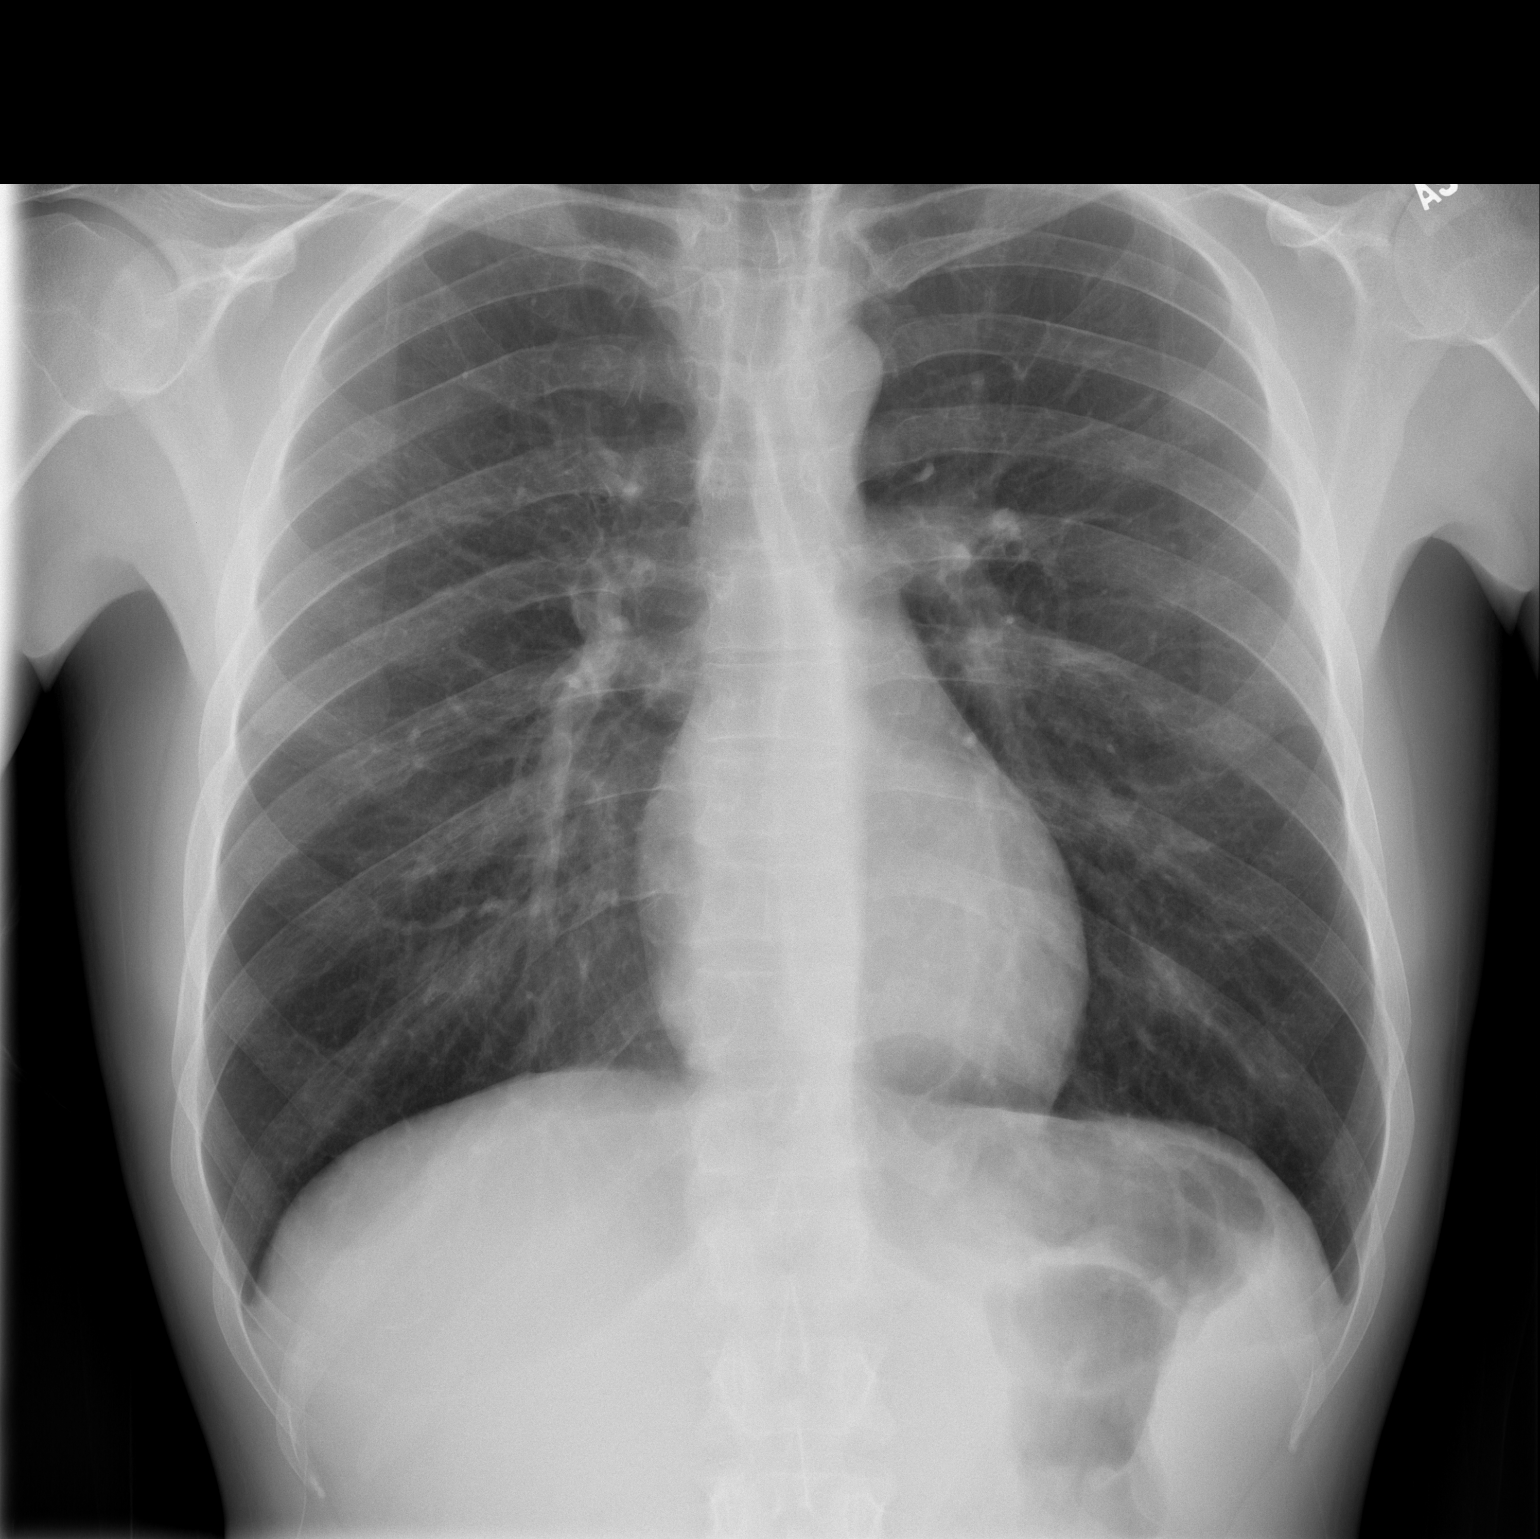

[w chest lat]
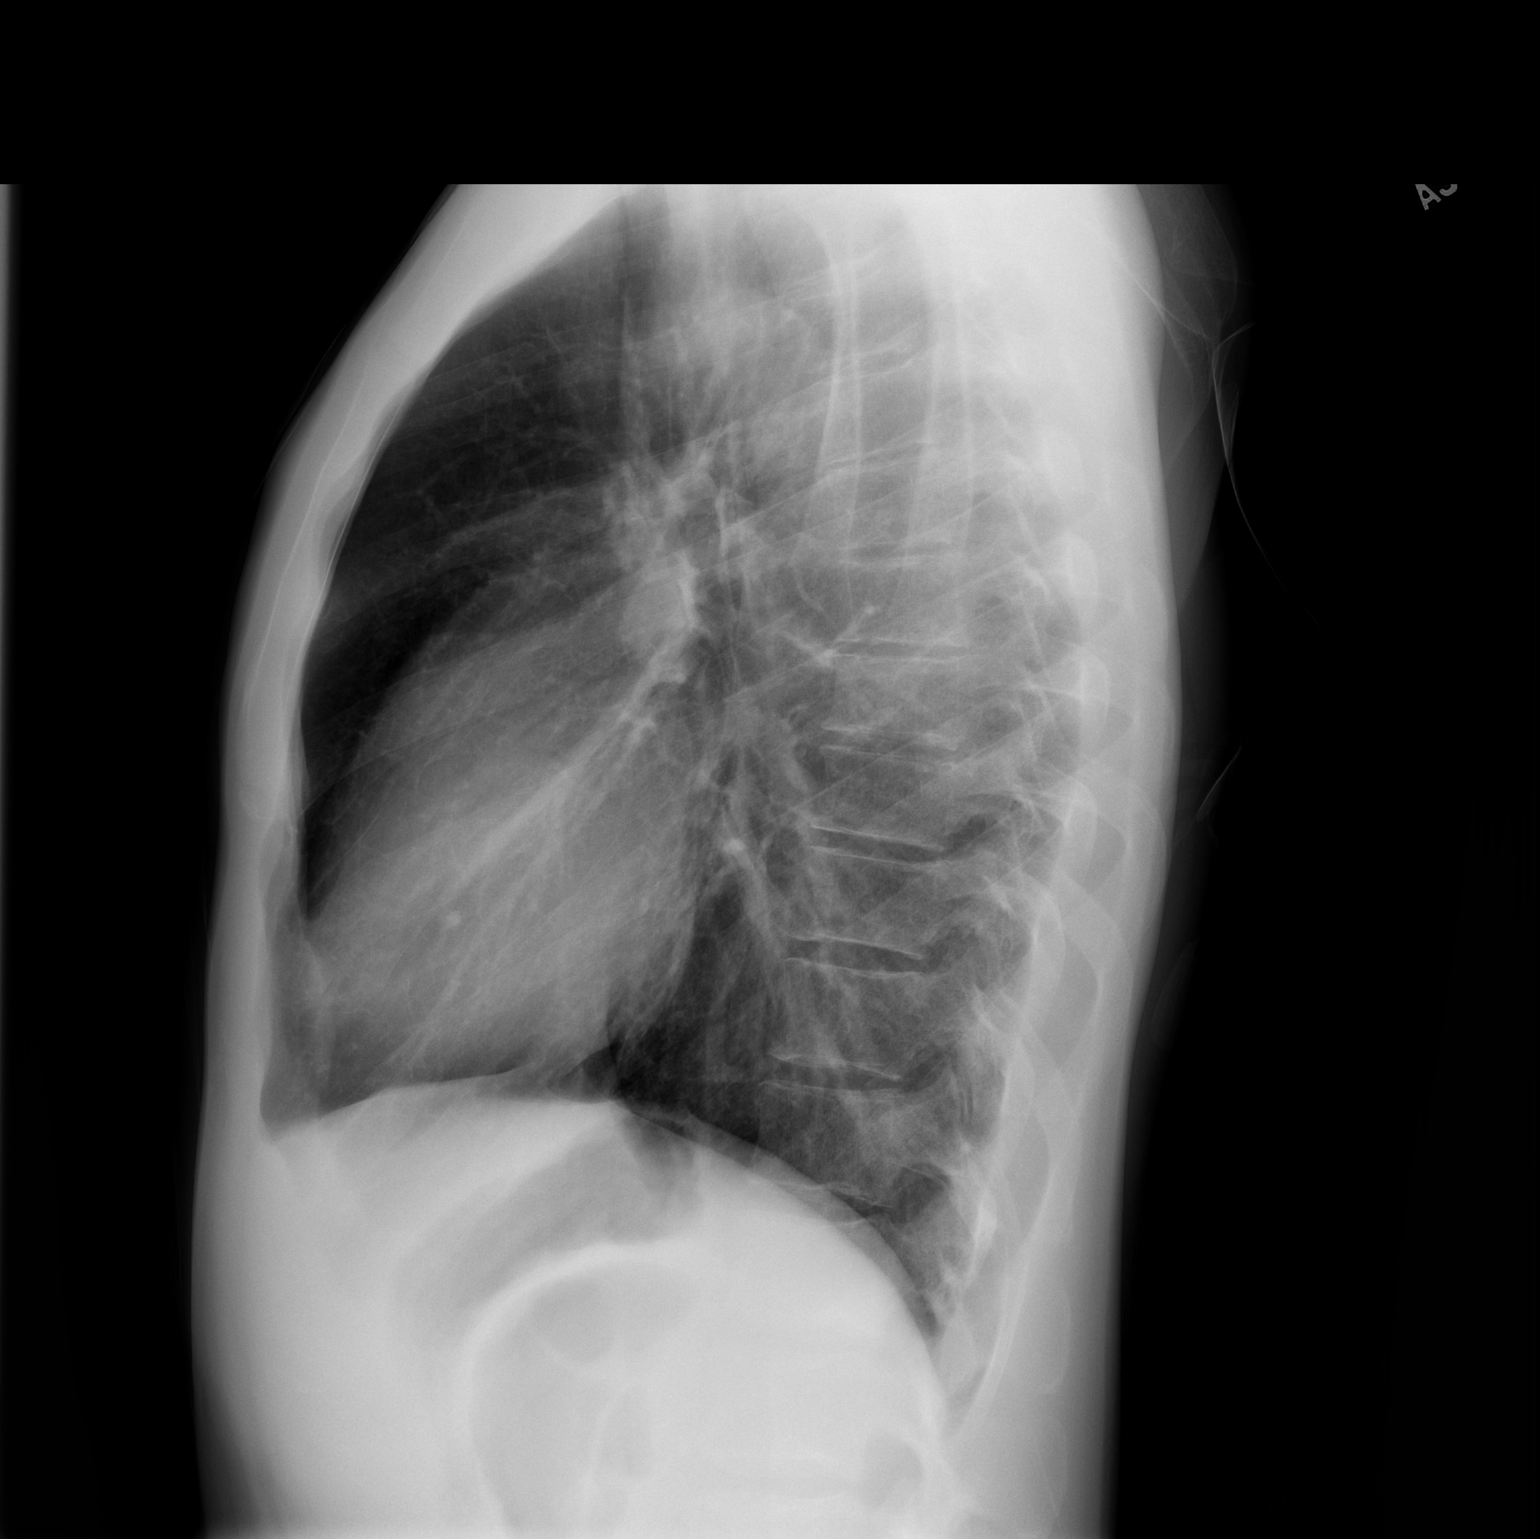

[2 of 2 positions shown; findings below may reference images not displayed]

FINDINGS: The cardiomediastinal contours are normal. The lungs are clear.
Pulmonary vasculature is normal. No consolidation, pleural effusion,
or pneumothorax. No acute osseous abnormalities are seen.
IMPRESSION: Negative radiographs of the chest.
